# Patient Record
Sex: Male | Born: 1960 | Hispanic: Yes | Marital: Married | Smoking: Never smoker
Health system: Southern US, Community
[De-identification: ages and names within clinical notes are randomized; demographics above are authoritative.]

## PROBLEM LIST (undated history)

## (undated) DIAGNOSIS — I10 Essential (primary) hypertension: Secondary | ICD-10-CM

---

## 2014-07-10 ENCOUNTER — Encounter (HOSPITAL_BASED_OUTPATIENT_CLINIC_OR_DEPARTMENT_OTHER): Payer: Self-pay | Admitting: Emergency Medicine

## 2014-07-10 DIAGNOSIS — Z79899 Other long term (current) drug therapy: Secondary | ICD-10-CM | POA: Diagnosis not present

## 2014-07-10 DIAGNOSIS — I10 Essential (primary) hypertension: Secondary | ICD-10-CM | POA: Insufficient documentation

## 2014-07-10 DIAGNOSIS — R05 Cough: Secondary | ICD-10-CM | POA: Diagnosis not present

## 2014-07-10 NOTE — ED Notes (Signed)
Pt and spouse report unrelenting cough has been tx by PMD with tamaflu and tussin cough med w/o relief cough continues

## 2014-07-11 ENCOUNTER — Emergency Department (HOSPITAL_BASED_OUTPATIENT_CLINIC_OR_DEPARTMENT_OTHER)
Admission: EM | Admit: 2014-07-11 | Discharge: 2014-07-11 | Disposition: A | Discharge status: Home / Self Care | Payer: 59 | Attending: Emergency Medicine | Admitting: Emergency Medicine

## 2014-07-11 ENCOUNTER — Emergency Department (HOSPITAL_BASED_OUTPATIENT_CLINIC_OR_DEPARTMENT_OTHER): Payer: 59

## 2014-07-11 DIAGNOSIS — R05 Cough: Secondary | ICD-10-CM

## 2014-07-11 DIAGNOSIS — R059 Cough, unspecified: Secondary | ICD-10-CM

## 2014-07-11 HISTORY — DX: Essential (primary) hypertension: I10

## 2014-07-11 MED ORDER — BENZONATATE 100 MG PO CAPS: 100.0000 mg | ORAL_CAPSULE | Freq: Three times a day (TID) | ORAL | Status: DC | PRN

## 2014-07-11 MED ORDER — BENZONATATE 100 MG PO CAPS
100.0000 mg | ORAL_CAPSULE | Freq: Once | ORAL | Status: AC
Start: 2014-07-11 — End: 2014-07-11
  Administered 2014-07-11: 100 mg via ORAL
  Filled 2014-07-11: qty 1

## 2014-07-11 NOTE — ED Notes (Signed)
[facility] Pt seen by ED[facility] prior to RN assessment, see MD notes, pending orders. Xray here for pt.

## 2014-07-11 NOTE — ED Provider Notes (Addendum)
CSN: [id number]     Arrival date & time 07/10/14  2206 History  This chart was scribed for Shon Baton, MD by Modena Jansky, ED Scribe. This patient was seen in room MH01/MH01 and the patient's care was started at 12:18 AM.   Chief Complaint  Patient presents with  . Cough   The history is provided by the patient. No language interpreter was used.   HPI Comments: Glenn Hooper is a 54 y.o. male who presents to the Emergency Department complaining of an intermittent moderate cough that started about 11 days ago. He reports that he has been having a persistent, nonproductive cough for the past 11 days. He reports that he recently saw a provider and given medication with temporary relief. He states that he has also been taking robitussin with minimal relief. He reports having a sore throat also. He denies any chest pain or abdominal pain.   Past Medical History  Diagnosis Date  . Hypertension    History reviewed. No pertinent past surgical history. History reviewed. No pertinent family history. History  Substance Use Topics  . Smoking status: Never Smoker   . Smokeless tobacco: Never Used  . Alcohol Use: No    Review of Systems  Constitutional: Negative.  Negative for fever.  Respiratory: Positive for cough. Negative for chest tightness and shortness of breath.   Cardiovascular: Negative.  Negative for chest pain.  Gastrointestinal: Negative.  Negative for abdominal pain.  Genitourinary: Negative.   Neurological: Negative for headaches.  All other systems reviewed and are negative.   Allergies  Review of patient's allergies indicates no known allergies.  Home Medications   Prior to Admission medications   Medication Sig Start Date End Date Taking? Authorizing Provider  benzonatate (TESSALON) 100 MG capsule Take 1 capsule (100 mg total) by mouth 3 (three) times daily as needed for cough. 07/11/14   Shon Baton, MD  lisinopril (PRINIVIL,ZESTRIL) 10 MG tablet Take 15 mg by  mouth daily.   Yes Historical Provider, MD   BP 134/65 mmHg  Pulse 67  Temp(Src) 97.9 F (36.6 C) (Oral)  Resp 20  Ht  (1.702 m)  Wt 197 lb (89.359 kg)  BMI 30.85 kg/m2  SpO2 96% Physical Exam  Constitutional: He is oriented to person, place, and time. He appears well-developed and well-nourished.  HENT:  Head: Normocephalic and atraumatic.  Cardiovascular: Normal rate, regular rhythm and normal heart sounds.   No murmur heard. Pulmonary/Chest: Effort normal and breath sounds normal. No respiratory distress. He has no wheezes.  Abdominal: Soft. There is no tenderness.  Musculoskeletal: He exhibits no edema.  Neurological: He is alert and oriented to person, place, and time.  Skin: Skin is warm and dry.  Psychiatric: He has a normal mood and affect.  Nursing note and vitals reviewed.   ED Course  Procedures (including critical care time) DIAGNOSTIC STUDIES: Oxygen Saturation is 96% on RA, normal by my interpretation.    COORDINATION OF CARE: 12:22 AM- Pt advised of plan for treatment which includes radiology and pt agrees.  Labs Review Labs Reviewed - No data to display  Imaging Review Dg Chest 2 View  07/11/2014   CLINICAL DATA:  Cough for 11 days.  EXAM: CHEST  2 VIEW  COMPARISON:  None.  FINDINGS: There is a shallow inspiration with associated crowding of the basilar markings. There may be mild linear atelectatic changes. There is no confluent airspace consolidation. There are no effusions. Hilar, mediastinal and cardiac contours are  unremarkable. Pulmonary vasculature is normal.  IMPRESSION: Shallow inspiration.  Mild linear basilar opacities.   Electronically Signed   By: Ellery Plunkaniel R Mitchell M.D.   On: 07/11/2014 00:50     EKG Interpretation None      MDM   Final diagnoses:  Cough     Ptient presents with persistent cough after being diagnosed with the flu several weeks ago. On exam. No evidence of respiratory distress. No wheezing. Chest x-ray without  evidence of pneumonia. Patient given Jerilynn Somessalon Perles. Discuss with patient and his wife that cough can persist following an upper respiratory illness like flu for several weeks. They should continue supportive care. Will discharge with Tessalon Perles. At this time do not feel that an inhaler would benefit the patient as he is a nonsmoker and has no wheezing on exam.  Of note, patient is on an ACE inhibitor. He has been on a simple inhibitor for some time. This can also cause cough but given recent URI symptoms suspect persistent cough from viral illness rather than ACE inhibitor induced cough.  After history, exam, and medical workup I feel the patient has been appropriately medically screened and is safe for discharge home. Pertinent diagnoses were discussed with the patient. Patient was given return precautions.  I personally performed the services described in this documentation, which was scribed in my presence. The recorded information has been reviewed and is accurate.     Shon Batonourtney F Leanny Moeckel, MD 07/11/14 [id number]  Shon Batonourtney F Ranjit Ashurst, MD 07/11/14 [phone]

## 2014-07-11 NOTE — ED Notes (Addendum)
Ambulatory to xray, steady gait, alert, NAD, calm, interactive, no dyspnea.

## 2014-07-11 NOTE — ED Notes (Addendum)
Alert, NAD, calm, interactive, (denies: congestion, fever, nvd, dizziness, sob, sore throat or pain), [facility] States, "cough is keeping me from sleeping". "robitussin not helping".

## 2014-07-11 NOTE — Discharge Instructions (Signed)
Cough, Adult   A cough is a reflex. It helps you clear your throat and airways. A cough can help heal your body. A cough can last 2 or 3 weeks (acute) or may last more than 8 weeks (chronic). Some common causes of a cough can include an infection, allergy, or a cold.  HOME CARE  · Only take medicine as told by your doctor.  · If given, take your medicines (antibiotics) as told. Finish them even if you start to feel better.  · Use a cold steam vaporizer or humidifier in your home. This can help loosen thick spit (secretions).  · Sleep so you are almost sitting up (semi-upright). Use pillows to do this. This helps reduce coughing.  · Rest as needed.  · Stop smoking if you smoke.  GET HELP RIGHT AWAY IF:  · You have yellowish-white fluid (pus) in your thick spit.  · Your cough gets worse.  · Your medicine does not reduce coughing, and you are losing sleep.  · You cough up blood.  · You have trouble breathing.  · Your pain gets worse and medicine does not help.  · You have a fever.  MAKE SURE YOU:   · Understand these instructions.  · Will watch your condition.  · Will get help right away if you are not doing well or get worse.  Document Released: 12/07/2010 Document Revised: 08/10/2013 Document Reviewed: 12/07/2010  ExitCare® Patient Information ©2015 [employer]. This information is not intended to replace advice given to you by your health care provider. Make sure you discuss any questions you have with your health care provider.

## 2015-02-13 ENCOUNTER — Encounter (HOSPITAL_BASED_OUTPATIENT_CLINIC_OR_DEPARTMENT_OTHER): Payer: Self-pay | Admitting: Emergency Medicine

## 2015-02-13 ENCOUNTER — Emergency Department (HOSPITAL_BASED_OUTPATIENT_CLINIC_OR_DEPARTMENT_OTHER)
Admission: EM | Admit: 2015-02-13 | Discharge: 2015-02-13 | Disposition: A | Discharge status: Home / Self Care | Payer: 59 | Attending: Emergency Medicine | Admitting: Emergency Medicine

## 2015-02-13 ENCOUNTER — Emergency Department (HOSPITAL_BASED_OUTPATIENT_CLINIC_OR_DEPARTMENT_OTHER): Payer: 59

## 2015-02-13 DIAGNOSIS — Z79899 Other long term (current) drug therapy: Secondary | ICD-10-CM | POA: Insufficient documentation

## 2015-02-13 DIAGNOSIS — I10 Essential (primary) hypertension: Secondary | ICD-10-CM | POA: Diagnosis not present

## 2015-02-13 DIAGNOSIS — R51 Headache: Secondary | ICD-10-CM

## 2015-02-13 DIAGNOSIS — R519 Headache, unspecified: Secondary | ICD-10-CM

## 2015-02-13 DIAGNOSIS — R0789 Other chest pain: Secondary | ICD-10-CM | POA: Insufficient documentation

## 2015-02-13 LAB — CBC WITH DIFFERENTIAL/PLATELET
BASOS PCT: 0 %
Basophils Absolute: 0 10*3/uL (ref 0.0–0.1)
EOS ABS: 0.1 10*3/uL (ref 0.0–0.7)
Eosinophils Relative: 2 %
HCT: 46.5 % (ref 39.0–52.0)
HEMOGLOBIN: 16 g/dL (ref 13.0–17.0)
Lymphocytes Relative: 27 %
Lymphs Abs: 2.1 10*3/uL (ref 0.7–4.0)
MCH: 30.4 pg (ref 26.0–34.0)
MCHC: 34.4 g/dL (ref 30.0–36.0)
MCV: 88.2 fL (ref 78.0–100.0)
Monocytes Absolute: 0.5 10*3/uL (ref 0.1–1.0)
Monocytes Relative: 6 %
NEUTROS PCT: 65 %
Neutro Abs: 4.9 10*3/uL (ref 1.7–7.7)
Platelets: 205 10*3/uL (ref 150–400)
RBC: 5.27 MIL/uL (ref 4.22–5.81)
RDW: 12.6 % (ref 11.5–15.5)
WBC: 7.6 10*3/uL (ref 4.0–10.5)

## 2015-02-13 LAB — TROPONIN I: Troponin I: <0.03 ng/mL (ref <0.031)

## 2015-02-13 LAB — URINALYSIS, ROUTINE W REFLEX MICROSCOPIC
BILIRUBIN URINE: NEGATIVE
GLUCOSE, UA: NEGATIVE mg/dL
HGB URINE DIPSTICK: NEGATIVE
KETONES UR: NEGATIVE mg/dL
Leukocytes, UA: NEGATIVE
Nitrite: NEGATIVE
PROTEIN: NEGATIVE mg/dL
Specific Gravity, Urine: 1.014 (ref 1.005–1.030)
UROBILINOGEN UA: 0.2 mg/dL (ref 0.0–1.0)
pH: 7 (ref 5.0–8.0)

## 2015-02-13 LAB — BASIC METABOLIC PANEL
Anion gap: 5 (ref 5–15)
BUN: 16 mg/dL (ref 6–20)
CO2: 29 mmol/L (ref 22–32)
Calcium: 9.7 mg/dL (ref 8.9–10.3)
Chloride: 106 mmol/L (ref 101–111)
Creatinine, Ser: 1.19 mg/dL (ref 0.61–1.24)
GFR calc Af Amer: >60 mL/min (ref >60)
GLUCOSE: 106 mg/dL — AB (ref 65–99)
Potassium: 4.6 mmol/L (ref 3.5–5.1)
SODIUM: 140 mmol/L (ref 135–145)

## 2015-02-13 MED ORDER — ACETAMINOPHEN 500 MG PO TABS
1000.0000 mg | ORAL_TABLET | Freq: Once | ORAL | Status: AC
  Administered 2015-02-13: 1000 mg via ORAL
  Filled 2015-02-13: qty 2

## 2015-02-13 MED ORDER — CLONIDINE HCL 0.1 MG PO TABS
0.1000 mg | ORAL_TABLET | Freq: Once | ORAL | Status: AC
  Administered 2015-02-13: 0.1 mg via ORAL
  Filled 2015-02-13: qty 1

## 2015-02-13 MED ORDER — CLONIDINE HCL 0.1 MG PO TABS: 0.1000 mg | ORAL_TABLET | Freq: Two times a day (BID) | ORAL | Status: AC

## 2015-02-13 MED ORDER — LISINOPRIL 10 MG PO TABS: 20.0000 mg | ORAL_TABLET | Freq: Every day | ORAL | Status: AC

## 2015-02-13 MED ORDER — LISINOPRIL 10 MG PO TABS
20.0000 mg | ORAL_TABLET | Freq: Once | ORAL | Status: AC
  Administered 2015-02-13: 20 mg via ORAL
  Filled 2015-02-13: qty 2

## 2015-02-13 NOTE — ED Provider Notes (Addendum)
CSN: [id number]     Arrival date & time 02/13/15  1734 History  By signing my name below, I, Terrance Branch, attest that this documentation has been prepared under the direction and in the presence of Arby BarretteMarcy Wren Gallaga, MD. Electronically Signed: Evon Slackerrance Branch, ED Scribe. 02/13/2015. 10:53 PM.    Chief Complaint  Patient presents with  . Headache    Patient is a 54 y.o. male presenting with headaches. The history is provided by the patient. No language interpreter was used.  Headache  HPI Comments: Billye Loreta Avecosta is a 54 y.o. male who presents to the Emergency Department complaining of HA onset 3 days prior. Pt st he thinks his HA's are related to his blood pressure. He states that his blood pressure has been elevated for the past 3 days in the 158/99 range. Pt states that he has tried increasing his blood pressure medication with no relief. Pt reports chest pressure and SOB that lasted for about 1 minute last night that resolved on its own. Denies leg swelling, vision changes, weakness, numbness, nausea, vomiting, fever chills, difficulty urinating.     Past Medical History  Diagnosis Date  . Hypertension    History reviewed. No pertinent past surgical history. History reviewed. No pertinent family history. Social History  Substance Use Topics  . Smoking status: Never Smoker   . Smokeless tobacco: Never Used  . Alcohol Use: No    Review of Systems  Neurological: Positive for headaches.   10 Systems reviewed and all are negative for acute change except as noted in the HPI.     Allergies  Review of patient's allergies indicates no known allergies.  Home Medications   Prior to Admission medications   Medication Sig Start Date End Date Taking? Authorizing Provider  cloNIDine (CATAPRES) 0.1 MG tablet Take 1 tablet (0.1 mg total) by mouth 2 (two) times daily. Take 1 tablet up to 3 times daily for blood pressure that remains greater than 140/90 after taking your prescribed  lisinopril. 02/13/15   Arby BarretteMarcy Carless Slatten, MD  lisinopril (PRINIVIL,ZESTRIL) 10 MG tablet Take 15 mg by mouth daily.    Historical Provider, MD  lisinopril (PRINIVIL,ZESTRIL) 10 MG tablet Take 2 tablets (20 mg total) by mouth daily. Take 20 mg of lisinopril in the morning and 10 mg lisinopril in the evening. 02/13/15   Arby BarretteMarcy Pier Bosher, MD   BP 114/76 mmHg  Pulse 57  Temp(Src) 97.9 F (36.6 C) (Oral)  Resp 20  Ht 5\' 8"  (1.727 m)  Wt 185 lb (83.915 kg)  BMI 28.14 kg/m2  SpO2 98%   Physical Exam  Constitutional: He is oriented to person, place, and time. He appears well-developed and well-nourished.  HENT:  Head: Normocephalic and atraumatic.  Eyes: EOM are normal. Pupils are equal, round, and reactive to light.  Neck: Neck supple.  Cardiovascular: Normal rate, regular rhythm, normal heart sounds and intact distal pulses.   Pulmonary/Chest: Effort normal and breath sounds normal.  Abdominal: Soft. Bowel sounds are normal. He exhibits no distension. There is no tenderness.  Musculoskeletal: Normal range of motion. He exhibits no edema.  Neurological: He is alert and oriented to person, place, and time. He has normal strength. Coordination normal. GCS eye subscore is 4. GCS verbal subscore is 5. GCS motor subscore is 6.  Skin: Skin is warm, dry and intact.  Psychiatric: He has a normal mood and affect.    ED Course  Procedures (including critical care time) DIAGNOSTIC STUDIES: Oxygen Saturation is 97% on RA, normal  by my interpretation.    COORDINATION OF CARE: 8:07 PM-Discussed treatment plan with pt at bedside and pt agreed to plan.     Labs Review Labs Reviewed  BASIC METABOLIC PANEL - Abnormal; Notable for the following:    Glucose, Bld 106 (*)    All other components within normal limits  URINALYSIS, ROUTINE W REFLEX MICROSCOPIC (NOT AT [facility]) - Abnormal; Notable for the following:    APPearance CLOUDY (*)    All other components within normal limits  TROPONIN I  CBC WITH  DIFFERENTIAL/PLATELET    Imaging Review Dg Chest 2 View  02/13/2015  CLINICAL DATA:  Chest pain and shortness of breath. Uncontrolled hypertension. EXAM: CHEST  2 VIEW COMPARISON:  07/11/2014 FINDINGS: Heart size and pulmonary vascularity are normal. Slight scarring at the left base laterally. The lungs are otherwise clear. No effusions. No osseous abnormality. IMPRESSION: No acute abnormality.  Slight scarring at the left lung base. Electronically Signed   By: Francene Boyers M.D.   On: 02/13/2015 20:35      EKG Interpretation   Date/Time:  Sunday February 13 2015 21:30:31 EST Ventricular Rate:  65 PR Interval:  150 QRS Duration: 108 QT Interval:  408 QTC Calculation: 424 R Axis:   -26 Text Interpretation:  Normal sinus rhythm Incomplete right bundle branch  block Borderline ECG Confirmed by Donnald Garre, MD, Lebron Conners [phone]) on 02/13/2015  10:42:12 PM     Recheck 22:45, patient reports he feels much better. He is has no symptoms at this time. MDM   Final diagnoses:  Essential hypertension  Acute nonintractable headache, unspecified headache type    Patient presents with elevated blood pressure over the past several days of 150s over 100s. He reports generalized headache of gradual onset in association with this. He also identifies some chest pressure yesterday evening. Patient was given an additional dose of his lisinopril and a 0.1 mg dose of clonidine. His blood pressure normalized with this treatment and he reported himself asymptomatic. At this time his diagnostic workup is negative. The patient be discharged with a lisinopril dose increased to 20 mg in the morning and 10 mg in the evening with clonidine to take on an as needed basis. He is advised that he needs to follow-up with his family physician this week for monitoring of blood pressures and management of his medications.     Arby Barrette, MD 02/13/15 2255  Arby Barrette, MD 02/13/15 2256

## 2015-02-13 NOTE — ED Notes (Signed)
States, "feel better", admits to some lingering mild dizziness, (denies: pain, HA, sob, nausea or other sx), VSS[facility]

## 2015-02-13 NOTE — Discharge Instructions (Signed)
Hypertension Hypertension, commonly called high blood pressure, is when the force of blood pumping through your arteries is too strong. Your arteries are the blood vessels that carry blood from your heart throughout your body. A blood pressure reading consists of a higher number over a lower number, such as 110/72. The higher number (systolic) is the pressure inside your arteries when your heart pumps. The lower number (diastolic) is the pressure inside your arteries when your heart relaxes. Ideally you want your blood pressure below 120/80. Hypertension forces your heart to work harder to pump blood. Your arteries may become narrow or stiff. Having untreated or uncontrolled hypertension can cause heart attack, stroke, kidney disease, and other problems. RISK FACTORS Some risk factors for high blood pressure are controllable. Others are not.  Risk factors you cannot control include:   Race. You may be at higher risk if you are African American.  Age. Risk increases with age.  Gender. Men are at higher risk than women before age 45 years. After age 65, women are at higher risk than men. Risk factors you can control include:  Not getting enough exercise or physical activity.  Being overweight.  Getting too much fat, sugar, calories, or salt in your diet.  Drinking too much alcohol. SIGNS AND SYMPTOMS Hypertension does not usually cause signs or symptoms. Extremely high blood pressure (hypertensive crisis) may cause headache, anxiety, shortness of breath, and nosebleed. DIAGNOSIS To check if you have hypertension, your health care provider will measure your blood pressure while you are seated, with your arm held at the level of your heart. It should be measured at least twice using the same arm. Certain conditions can cause a difference in blood pressure between your right and left arms. A blood pressure reading that is higher than normal on one occasion does not mean that you need treatment. If  it is not clear whether you have high blood pressure, you may be asked to return on a different day to have your blood pressure checked again. Or, you may be asked to monitor your blood pressure at home for 1 or more weeks. TREATMENT Treating high blood pressure includes making lifestyle changes and possibly taking medicine. Living a healthy lifestyle can help lower high blood pressure. You may need to change some of your habits. Lifestyle changes may include:  Following the DASH diet. This diet is high in fruits, vegetables, and whole grains. It is low in salt, red meat, and added sugars.  Keep your sodium intake below 2,300 mg per day.  Getting at least 30-45 minutes of aerobic exercise at least 4 times per week.  Losing weight if necessary.  Not smoking.  Limiting alcoholic beverages.  Learning ways to reduce stress. Your health care provider may prescribe medicine if lifestyle changes are not enough to get your blood pressure under control, and if one of the following is true:  You are 18-59 years of age and your systolic blood pressure is above 140.  You are 60 years of age or older, and your systolic blood pressure is above 150.  Your diastolic blood pressure is above 90.  You have diabetes, and your systolic blood pressure is over 140 or your diastolic blood pressure is over 90.  You have kidney disease and your blood pressure is above 140/90.  You have heart disease and your blood pressure is above 140/90. Your personal target blood pressure may vary depending on your medical conditions, your age, and other factors. HOME CARE INSTRUCTIONS    Have your blood pressure rechecked as directed by your health care provider.   Take medicines only as directed by your health care provider. Follow the directions carefully. Blood pressure medicines must be taken as prescribed. The medicine does not work as well when you skip doses. Skipping doses also puts you at risk for  problems.  Do not smoke.   Monitor your blood pressure at home as directed by your health care provider. SEEK MEDICAL CARE IF:   You think you are having a reaction to medicines taken.  You have recurrent headaches or feel dizzy.  You have swelling in your ankles.  You have trouble with your vision. SEEK IMMEDIATE MEDICAL CARE IF:  You develop a severe headache or confusion.  You have unusual weakness, numbness, or feel faint.  You have severe chest or abdominal pain.  You vomit repeatedly.  You have trouble breathing. MAKE SURE YOU:   Understand these instructions.  Will watch your condition.  Will get help right away if you are not doing well or get worse.   This information is not intended to replace advice given to you by your health care provider. Make sure you discuss any questions you have with your health care provider.   Document Released: 03/26/2005 Document Revised: 08/10/2014 Document Reviewed: 01/16/2013 Elsevier Interactive Patient Education 2016 Elsevier Inc. DASH Eating Plan DASH stands for "Dietary Approaches to Stop Hypertension." The DASH eating plan is a healthy eating plan that has been shown to reduce high blood pressure (hypertension). Additional health benefits may include reducing the risk of type 2 diabetes mellitus, heart disease, and stroke. The DASH eating plan may also help with weight loss. WHAT DO I NEED TO KNOW ABOUT THE DASH EATING PLAN? For the DASH eating plan, you will follow these general guidelines:  Choose foods with a percent daily value for sodium of less than 5% (as listed on the food label).  Use salt-free seasonings or herbs instead of table salt or sea salt.  Check with your health care provider or pharmacist before using salt substitutes.  Eat lower-sodium products, often labeled as "lower sodium" or "no salt added."  Eat fresh foods.  Eat more vegetables, fruits, and low-fat dairy products.  Choose whole grains.  Look for the word "whole" as the first word in the ingredient list.  Choose fish and skinless chicken or turkey more often than red meat. Limit fish, poultry, and meat to 6 oz (170 g) each day.  Limit sweets, desserts, sugars, and sugary drinks.  Choose heart-healthy fats.  Limit cheese to 1 oz (28 g) per day.  Eat more home-cooked food and less restaurant, buffet, and fast food.  Limit fried foods.  Cook foods using methods other than frying.  Limit canned vegetables. If you do use them, rinse them well to decrease the sodium.  When eating at a restaurant, ask that your food be prepared with less salt, or no salt if possible. WHAT FOODS CAN I EAT? Seek help from a dietitian for individual calorie needs. Grains Whole grain or whole wheat bread. Brown rice. Whole grain or whole wheat pasta. Quinoa, bulgur, and whole grain cereals. Low-sodium cereals. Corn or whole wheat flour tortillas. Whole grain cornbread. Whole grain crackers. Low-sodium crackers. Vegetables Fresh or frozen vegetables (raw, steamed, roasted, or grilled). Low-sodium or reduced-sodium tomato and vegetable juices. Low-sodium or reduced-sodium tomato sauce and paste. Low-sodium or reduced-sodium canned vegetables.  Fruits All fresh, canned (in natural juice), or frozen fruits. Meat and Other   Protein Products Ground beef (85% or leaner), grass-fed beef, or beef trimmed of fat. Skinless chicken or turkey. Ground chicken or turkey. Pork trimmed of fat. All fish and seafood. Eggs. Dried beans, peas, or lentils. Unsalted nuts and seeds. Unsalted canned beans. Dairy Low-fat dairy products, such as skim or 1% milk, 2% or reduced-fat cheeses, low-fat ricotta or cottage cheese, or plain low-fat yogurt. Low-sodium or reduced-sodium cheeses. Fats and Oils Tub margarines without trans fats. Light or reduced-fat mayonnaise and salad dressings (reduced sodium). Avocado. Safflower, olive, or canola oils. Natural peanut or almond  butter. Other Unsalted popcorn and pretzels. The items listed above may not be a complete list of recommended foods or beverages. Contact your dietitian for more options. WHAT FOODS ARE NOT RECOMMENDED? Grains White bread. White pasta. White rice. Refined cornbread. Bagels and croissants. Crackers that contain trans fat. Vegetables Creamed or fried vegetables. Vegetables in a cheese sauce. Regular canned vegetables. Regular canned tomato sauce and paste. Regular tomato and vegetable juices. Fruits Dried fruits. Canned fruit in light or heavy syrup. Fruit juice. Meat and Other Protein Products Fatty cuts of meat. Ribs, chicken wings, bacon, sausage, bologna, salami, chitterlings, fatback, hot dogs, bratwurst, and packaged luncheon meats. Salted nuts and seeds. Canned beans with salt. Dairy Whole or 2% milk, cream, half-and-half, and cream cheese. Whole-fat or sweetened yogurt. Full-fat cheeses or blue cheese. Nondairy creamers and whipped toppings. Processed cheese, cheese spreads, or cheese curds. Condiments Onion and garlic salt, seasoned salt, table salt, and sea salt. Canned and packaged gravies. Worcestershire sauce. Tartar sauce. Barbecue sauce. Teriyaki sauce. Soy sauce, including reduced sodium. Steak sauce. Fish sauce. Oyster sauce. Cocktail sauce. Horseradish. Ketchup and mustard. Meat flavorings and tenderizers. Bouillon cubes. Hot sauce. Tabasco sauce. Marinades. Taco seasonings. Relishes. Fats and Oils Butter, stick margarine, lard, shortening, ghee, and bacon fat. Coconut, palm kernel, or palm oils. Regular salad dressings. Other Pickles and olives. Salted popcorn and pretzels. The items listed above may not be a complete list of foods and beverages to avoid. Contact your dietitian for more information. WHERE CAN I FIND MORE INFORMATION? National Heart, Lung, and Blood Institute: www.nhlbi.nih.gov/health/health-topics/topics/dash/   This information is not intended to replace  advice given to you by your health care provider. Make sure you discuss any questions you have with your health care provider.   Document Released: 03/15/2011 Document Revised: 04/16/2014 Document Reviewed: 01/28/2013 Elsevier Interactive Patient Education 2016 Elsevier Inc.  

## 2015-02-13 NOTE — ED Notes (Signed)
Pt reports headache from high blood pressure x 3 days

## 2017-02-02 ENCOUNTER — Emergency Department (HOSPITAL_BASED_OUTPATIENT_CLINIC_OR_DEPARTMENT_OTHER): Payer: BLUE CROSS/BLUE SHIELD

## 2017-02-02 ENCOUNTER — Emergency Department (HOSPITAL_BASED_OUTPATIENT_CLINIC_OR_DEPARTMENT_OTHER)
Admission: EM | Admit: 2017-02-02 | Discharge: 2017-02-03 | Disposition: A | Discharge status: Home / Self Care | Payer: BLUE CROSS/BLUE SHIELD | Attending: Physician Assistant | Admitting: Physician Assistant

## 2017-02-02 ENCOUNTER — Encounter (HOSPITAL_BASED_OUTPATIENT_CLINIC_OR_DEPARTMENT_OTHER): Payer: Self-pay | Admitting: Emergency Medicine

## 2017-02-02 DIAGNOSIS — I10 Essential (primary) hypertension: Secondary | ICD-10-CM | POA: Diagnosis present

## 2017-02-02 DIAGNOSIS — Z79899 Other long term (current) drug therapy: Secondary | ICD-10-CM | POA: Insufficient documentation

## 2017-02-02 DIAGNOSIS — G43009 Migraine without aura, not intractable, without status migrainosus: Secondary | ICD-10-CM | POA: Insufficient documentation

## 2017-02-02 NOTE — ED Triage Notes (Signed)
PT presents with c/o htn today 155/97 at home and took 25mg  of lisinopril. Pt reports headache 8/10 and dizziness.

## 2017-02-02 NOTE — ED Notes (Signed)
Patient stated that he has a headache due to BP of 143/88.  He claimed that this is high for him.

## 2017-02-03 MED ORDER — DIPHENHYDRAMINE HCL 25 MG PO CAPS
25.0000 mg | ORAL_CAPSULE | Freq: Once | ORAL | Status: AC
  Administered 2017-02-03: 25 mg via ORAL
  Filled 2017-02-03: qty 1

## 2017-02-03 MED ORDER — KETOROLAC TROMETHAMINE 30 MG/ML IJ SOLN
30.0000 mg | Freq: Once | INTRAMUSCULAR | Status: AC
  Administered 2017-02-03: 30 mg via INTRAVENOUS
  Filled 2017-02-03: qty 1

## 2017-02-03 MED ORDER — PROCHLORPERAZINE EDISYLATE 5 MG/ML IJ SOLN
5.0000 mg | Freq: Once | INTRAMUSCULAR | Status: AC
  Administered 2017-02-03: 5 mg via INTRAVENOUS
  Filled 2017-02-03: qty 2

## 2017-02-03 MED ORDER — SODIUM CHLORIDE 0.9 % IV BOLUS (SEPSIS)
1000.0000 mL | Freq: Once | INTRAVENOUS | Status: AC
  Administered 2017-02-03: 1000 mL via INTRAVENOUS

## 2017-02-03 NOTE — ED Notes (Signed)
ED Provider at bedside. 

## 2017-02-03 NOTE — ED Provider Notes (Signed)
[facility] EMERGENCY DEPARTMENT Provider Note   CSN: [id number] Arrival date & time: 02/02/17  2143     History   Chief Complaint Chief Complaint  Patient presents with  . Hypertension    HPI Glenn Hooper is a 56 y.o. male.  HPI   56 year old male presenting with hypertension and headache.  Patient's wife reports that he has been taking his blood pressure noted to be high as well as him having a headache.  Patient is on 5 lisinopril every other day.  She reports when he is on 5 every day his he gets too low for blood pressure.  Patient has a headache for the last day.  Patient's wife said he has a history of headaches.  Patient's had brain MRIs in the past done in the [place] that have been reassuring.  Past Medical History:  Diagnosis Date  . Hypertension     There are no active problems to display for this patient.   History reviewed. No pertinent surgical history.     Home Medications    Prior to Admission medications   Medication Sig Start Date End Date Taking? Authorizing Provider  cloNIDine (CATAPRES) 0.1 MG tablet Take 1 tablet (0.1 mg total) by mouth 2 (two) times daily. Take 1 tablet up to 3 times daily for blood pressure that remains greater than 140/90 after taking your prescribed lisinopril. 02/13/15   Arby BarrettePfeiffer, Marcy, MD  lisinopril (PRINIVIL,ZESTRIL) 10 MG tablet Take 15 mg by mouth daily.    [provider]  lisinopril (PRINIVIL,ZESTRIL) 10 MG tablet Take 2 tablets (20 mg total) by mouth daily. Take 20 mg of lisinopril in the morning and 10 mg lisinopril in the evening. 02/13/15   Arby BarrettePfeiffer, Marcy, MD    Family History No family history on file.  Social History Social History  Substance Use Topics  . Smoking status: Never Smoker  . Smokeless tobacco: Never Used  . Alcohol use No     Allergies   Patient has no known allergies.   Review of Systems Review of Systems  Constitutional: Negative for activity change.    Respiratory: Negative for shortness of breath.   Cardiovascular: Negative for chest pain.  Gastrointestinal: Negative for abdominal pain.  Neurological: Positive for headaches.     Physical Exam Updated Vital Signs BP (!) 144/95   Pulse 70   Temp 98.4 F (36.9 C) (Oral)   Resp 20   Ht 5\' 8"  (1.727 m)   Wt 83.9 kg (185 lb)   SpO2 98%   BMI 28.13 kg/m   Physical Exam  Constitutional: He is oriented to person, place, and time. He appears well-nourished.  HENT:  Head: Normocephalic.  Eyes: Conjunctivae are normal.  Cardiovascular: Normal rate and regular rhythm.   Pulmonary/Chest: Effort normal and breath sounds normal. No respiratory distress.  Abdominal: Soft. He exhibits no distension. There is no tenderness.  Neurological: He is oriented to person, place, and time.  Equal strength bilaterally upper and lower extremities negative pronator drift. Normal sensation bilaterally. Speech comprehensible, no slurring. Facial nerve tested and appears grossly normal. Alert and oriented 3.   Skin: Skin is warm and dry. He is not diaphoretic.  Psychiatric: He has a normal mood and affect. His behavior is normal.     ED Treatments / Results  Labs (all labs ordered are listed, but only abnormal results are displayed) Labs Reviewed - No data to display  EKG  EKG Interpretation  Date/Time:  Saturday February 02 2017  22:05:16 EDT Ventricular Rate:  64 PR Interval:  174 QRS Duration: 114 QT Interval:  416 QTC Calculation: 429 R Axis:   -17 Text Interpretation:  Normal sinus rhythm Incomplete right bundle branch block Borderline ECG No significant change since last tracing Confirmed by Bary Castilla ([id number]) on 02/02/2017 10:58:11 PM       Radiology Ct Head Wo Contrast  Result Date: 02/02/2017 CLINICAL DATA:  56 year old male with generalized headache and dizziness. EXAM: CT HEAD WITHOUT CONTRAST TECHNIQUE: Contiguous axial images were obtained from the base of the skull  through the vertex without intravenous contrast. COMPARISON:  None. FINDINGS: Brain: The ventricles and sulci appropriate size for patient's age. Age the gray-white matter discrimination is preserved. There is no acute intracranial hemorrhage. No mass effect or midline shift noted. No extra-axial fluid collections. Vascular: No hyperdense vessel or unexpected calcification. Skull: Normal. Negative for fracture or focal lesion. Sinuses/Orbits: Mild mucoperiosteal thickening of paranasal sinuses. No air-fluid levels. Mastoid air cells are clear. Other: None IMPRESSION: Normal unenhanced CT of the brain. Electronically Signed   By: Elgie Collard M.D.   On: 02/02/2017 23:28    Procedures Procedures (including critical care time)  Medications Ordered in ED Medications  prochlorperazine (COMPAZINE) injection 5 mg (not administered)  diphenhydrAMINE (BENADRYL) capsule 25 mg (not administered)  sodium chloride 0.9 % bolus 1,000 mL (not administered)  ketorolac (TORADOL) 30 MG/ML injection 30 mg (not administered)     Initial Impression / Assessment and Plan / ED Course  I have reviewed the triage vital signs and the nursing notes.  Pertinent labs & imaging results that were available during my care of the patient were reviewed by me and considered in my medical decision making (see chart for details).     56 year old male presenting with hypertension and headache.  Patient's wife reports that he has been taking his blood pressure noted to be high as well as him having a headache.  Patient is on 5 lisinopril every other day.  She reports when he is on 5 every day his he gets too low for blood pressure.  Patient has a headache for the last day.  Patient's wife said he has a history of headaches.  Patient's had brain MRIs in the past done in the [place] that have been reassuring. 12:48 AM Patient CT is normal.  Patient's blood pressure is reassuring.  We will give migraine treatment and  then outpatient follow-up with outpatient physicians.  Final Clinical Impressions(s) / ED Diagnoses   Final diagnoses:  None    New Prescriptions New Prescriptions   No medications on file     Abelino Derrick, MD 02/03/17 [phone]

## 2017-02-24 ENCOUNTER — Emergency Department (HOSPITAL_BASED_OUTPATIENT_CLINIC_OR_DEPARTMENT_OTHER): Payer: BLUE CROSS/BLUE SHIELD

## 2017-02-24 ENCOUNTER — Other Ambulatory Visit: Payer: Self-pay

## 2017-02-24 ENCOUNTER — Emergency Department (HOSPITAL_BASED_OUTPATIENT_CLINIC_OR_DEPARTMENT_OTHER)
Admission: EM | Admit: 2017-02-24 | Discharge: 2017-02-24 | Disposition: A | Discharge status: Home / Self Care | Payer: BLUE CROSS/BLUE SHIELD | Attending: Emergency Medicine | Admitting: Emergency Medicine

## 2017-02-24 ENCOUNTER — Encounter (HOSPITAL_BASED_OUTPATIENT_CLINIC_OR_DEPARTMENT_OTHER): Payer: Self-pay | Admitting: *Deleted

## 2017-02-24 DIAGNOSIS — I10 Essential (primary) hypertension: Secondary | ICD-10-CM | POA: Insufficient documentation

## 2017-02-24 DIAGNOSIS — R079 Chest pain, unspecified: Secondary | ICD-10-CM | POA: Diagnosis present

## 2017-02-24 DIAGNOSIS — Z79899 Other long term (current) drug therapy: Secondary | ICD-10-CM | POA: Insufficient documentation

## 2017-02-24 LAB — CBC WITH DIFFERENTIAL/PLATELET
Basophils Absolute: 0 10*3/uL (ref 0.0–0.1)
Basophils Relative: 0 %
Eosinophils Absolute: 0.1 10*3/uL (ref 0.0–0.7)
Eosinophils Relative: 1 %
HCT: 40.6 % (ref 39.0–52.0)
Hemoglobin: 13.7 g/dL (ref 13.0–17.0)
Lymphocytes Relative: 25 %
Lymphs Abs: 1.5 10*3/uL (ref 0.7–4.0)
MCH: 30 pg (ref 26.0–34.0)
MCHC: 33.7 g/dL (ref 30.0–36.0)
MCV: 89 fL (ref 78.0–100.0)
MONOS PCT: 7 %
Monocytes Absolute: 0.4 10*3/uL (ref 0.1–1.0)
NEUTROS ABS: 4 10*3/uL (ref 1.7–7.7)
Neutrophils Relative %: 67 %
PLATELETS: 177 10*3/uL (ref 150–400)
RBC: 4.56 MIL/uL (ref 4.22–5.81)
RDW: 12.9 % (ref 11.5–15.5)
WBC: 6.1 10*3/uL (ref 4.0–10.5)

## 2017-02-24 LAB — BASIC METABOLIC PANEL
Anion gap: 4 — ABNORMAL LOW (ref 5–15)
BUN: 17 mg/dL (ref 6–20)
CO2: 27 mmol/L (ref 22–32)
Calcium: 9 mg/dL (ref 8.9–10.3)
Chloride: 105 mmol/L (ref 101–111)
Creatinine, Ser: 1.01 mg/dL (ref 0.61–1.24)
GFR calc Af Amer: >60 mL/min (ref >60)
GFR calc non Af Amer: >60 mL/min (ref >60)
GLUCOSE: 106 mg/dL — AB (ref 65–99)
Potassium: 3.4 mmol/L — ABNORMAL LOW (ref 3.5–5.1)
Sodium: 136 mmol/L (ref 135–145)

## 2017-02-24 LAB — TROPONIN I
Troponin I: <0.03 ng/mL (ref <0.03)
Troponin I: <0.03 ng/mL (ref <0.03)

## 2017-02-24 LAB — D-DIMER, QUANTITATIVE: D-Dimer, Quant: 0.41 ug/mL-FEU (ref 0.00–0.50)

## 2017-02-24 NOTE — ED Provider Notes (Signed)
[facility] EMERGENCY DEPARTMENT Provider Note   CSN: [id number] Arrival date & time: 02/24/17  0054     History   Chief Complaint Chief Complaint  Patient presents with  . Chest Pain    HPI Glenn Hooper is a 56 y.o. male.  Patient is a 56 year old male with a history of hypertension who presents with chest pain.  He states around 1130 this evening he had been sleeping but woke up and started having some pain to the center of his chest, it radiated from side to side and he had some tingling in his left hand.  He describes the pain as a pressure feeling.  There is no associated back pain.  He walked to the bathroom and got a little lightheaded.  The symptoms lasted about an hour and he has had no recurrence of the symptoms.  He denies any associated shortness of breath.  No nausea or vomiting.  No diaphoresis.  No prior history of heart problems.  No leg pain or swelling other than that he recently traveled to [place] and reports that he had some intermittent leg swelling which he attributes to the heat there.  He denies any calf tenderness.  No pleuritic type chest pain.  No cough or congestion.  No family history of heart problems.  He has a history of hypertension but no history of hyperlipidemia or diabetes.  He is a non-smoker.      Past Medical History:  Diagnosis Date  . Hypertension     There are no active problems to display for this patient.   History reviewed. No pertinent surgical history.     Home Medications    Prior to Admission medications   Medication Sig Start Date End Date Taking? Authorizing Provider  lisinopril (PRINIVIL,ZESTRIL) 10 MG tablet Take 2 tablets (20 mg total) by mouth daily. Take 20 mg of lisinopril in the morning and 10 mg lisinopril in the evening. 02/13/15  Yes Arby BarrettePfeiffer, Marcy, MD  cloNIDine (CATAPRES) 0.1 MG tablet Take 1 tablet (0.1 mg total) by mouth 2 (two) times daily. Take 1 tablet up to 3 times daily for blood  pressure that remains greater than 140/90 after taking your prescribed lisinopril. 02/13/15   Arby BarrettePfeiffer, Marcy, MD  lisinopril (PRINIVIL,ZESTRIL) 10 MG tablet Take 15 mg by mouth daily.    [provider]    Family History No family history on file.  Social History Social History   Tobacco Use  . Smoking status: Never Smoker  . Smokeless tobacco: Never Used  Substance Use Topics  . Alcohol use: No  . Drug use: No     Allergies   Patient has no known allergies.   Review of Systems Review of Systems  Constitutional: Negative for chills, diaphoresis, fatigue and fever.  HENT: Negative for congestion, rhinorrhea and sneezing.   Eyes: Negative.   Respiratory: Negative for cough, chest tightness and shortness of breath.   Cardiovascular: Positive for chest pain. Negative for leg swelling.  Gastrointestinal: Negative for abdominal pain, blood in stool, diarrhea, nausea and vomiting.  Genitourinary: Negative for difficulty urinating, flank pain, frequency and hematuria.  Musculoskeletal: Negative for arthralgias and back pain.  Skin: Negative for rash.  Neurological: Negative for dizziness, speech difficulty, weakness, numbness and headaches.     Physical Exam Updated Vital Signs BP 124/84   Pulse 62   Temp 98 F (36.7 C) (Oral)   Resp 15   SpO2 97%   Physical Exam  Constitutional: He is  oriented to person, place, and time. He appears well-developed and well-nourished.  HENT:  Head: Normocephalic and atraumatic.  Eyes: Pupils are equal, round, and reactive to light.  Neck: Normal range of motion. Neck supple.  Cardiovascular: Normal rate, regular rhythm and normal heart sounds.  Pulmonary/Chest: Effort normal and breath sounds normal. No respiratory distress. He has no wheezes. He has no rales. He exhibits no tenderness.  Abdominal: Soft. Bowel sounds are normal. There is no tenderness. There is no rebound and no guarding.  Musculoskeletal: Normal range of  motion. He exhibits no edema.  No edema or calf tenderness  Lymphadenopathy:    He has no cervical adenopathy.  Neurological: He is alert and oriented to person, place, and time.  Skin: Skin is warm and dry. No rash noted.  Psychiatric: He has a normal mood and affect.     ED Treatments / Results  Labs (all labs ordered are listed, but only abnormal results are displayed) Labs Reviewed  BASIC METABOLIC PANEL - Abnormal; Notable for the following components:      Result Value   Potassium 3.4 (*)    Glucose, Bld 106 (*)    Anion gap 4 (*)    All other components within normal limits  CBC WITH DIFFERENTIAL/PLATELET  TROPONIN I  D-DIMER, QUANTITATIVE (NOT AT [facility])  TROPONIN I    EKG  EKG Interpretation  Date/Time:  Sunday February 24 2017 00:59:57 EST Ventricular Rate:  71 PR Interval:  158 QRS Duration: 106 QT Interval:  416 QTC Calculation: 452 R Axis:   25 Text Interpretation:  Normal sinus rhythm Incomplete right bundle branch block Nonspecific ST abnormality Abnormal ECG since last tracing no significant change Confirmed by Rolan Bucco [phone]) on 02/24/2017 1:34:30 AM       Radiology Dg Chest 2 View  Result Date: 02/24/2017 CLINICAL DATA:  Subacute onset of generalized chest pain. Hands trembling. EXAM: CHEST  2 VIEW COMPARISON:  Chest radiograph performed 02/13/2015 FINDINGS: Mild left basilar atelectasis or scarring is noted. No pleural effusion or pneumothorax is seen. The heart is normal in size. No acute osseous abnormalities are identified. IMPRESSION: Mild left basilar atelectasis or scarring noted. Lungs otherwise clear. Electronically Signed   By: Roanna Raider M.D.   On: 02/24/2017 01:46    Procedures Procedures (including critical care time)  Medications Ordered in ED Medications - No data to display   Initial Impression / Assessment and Plan / ED Course  I have reviewed the triage vital signs and the nursing notes.  Pertinent labs & imaging  results that were available during my care of the patient were reviewed by me and considered in my medical decision making (see chart for details).    Patient is a 56 year old male with a history of hypertension who presents with chest pain.  His EKG does not show any ischemic changes.  He has had 2- troponins.  His d-dimer is negative and he has no other symptoms that would be more suggestive of pulmonary embolus.  He has a heart score of 3.  Given this, in discussion with the patient and his wife, we have decided that he will be discharged home and will have close follow-up with his PCP.  He has had no chest pain since arrival to the ED.  He has had no exertional symptoms.  He was discharged home in good condition.  He was advised to follow-up with his primary care physician within the next 2-3 days.  Return precautions were given.  Final Clinical Impressions(s) / ED Diagnoses   Final diagnoses:  Nonspecific chest pain    ED Discharge Orders    None       Rolan BuccoBelfi, Katalin Colledge, MD 02/24/17 0600

## 2017-02-24 NOTE — ED Triage Notes (Signed)
Pt woke with chest pain 30 mins pta. Describes it as pressure. [place] air travel from [place]

## 2017-02-24 NOTE — ED Notes (Signed)
No changes. EDP in to update pt/family on results and plan. VSS. Resting comfortably.

## 2017-02-24 NOTE — ED Notes (Signed)
No changes. Denies pain, nausea or sob. Mentions some light headedness. Denies questions or needs. VSS. NSR on monitor.

## 2017-02-24 NOTE — ED Notes (Signed)
Alert, NAD, calm, interactive, resps e/u, speaking in clear complete sentences, no dyspnea noted, skin W&D, VSS, here for earlier CP, [place] sx resolved, (denies: pain, sob, nausea, dizziness or visual changes). [facility]  EDP into room, prior to RN assessment, see MD notes, orders received and initiated.

## 2019-04-12 IMAGING — CT CT HEAD W/O CM
3 series · 15 of 47 positions shown, 18 images · non-contrast
Comparison: None.

CLINICAL DATA: 56-year-old male with generalized headache and
dizziness.

EXAM:
CT HEAD WITHOUT CONTRAST
TECHNIQUE: Contiguous axial images were obtained from the base of the skull
through the vertex without intravenous contrast.

[Series 2: head wo · axial · 0.46mm/px · z∈[-101,+24]mm · 9 of 31 slices shown, 12 images]
[im 3/31  brain]
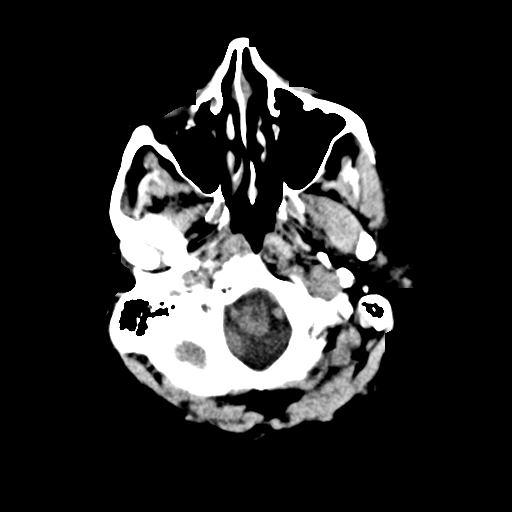
[im 3/31  bone]
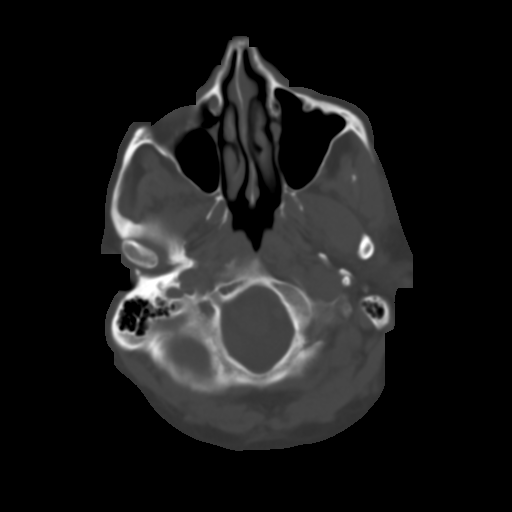
[im 6/31  brain]
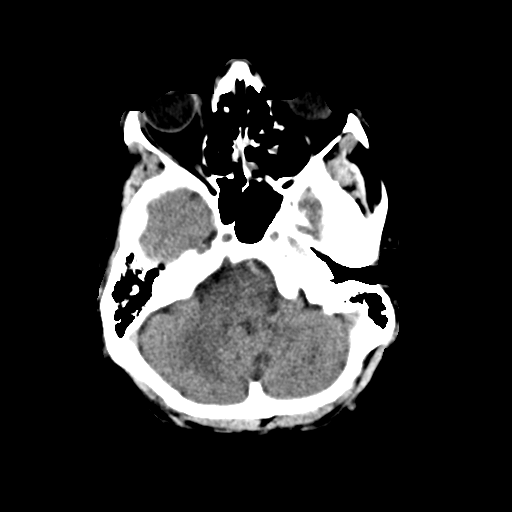
[im 9/31  brain]
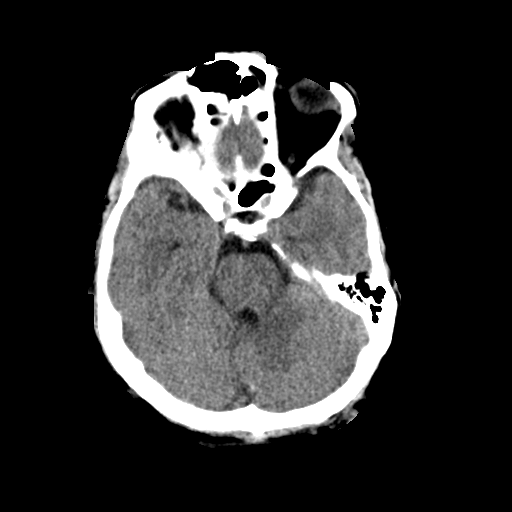
[im 12/31  brain]
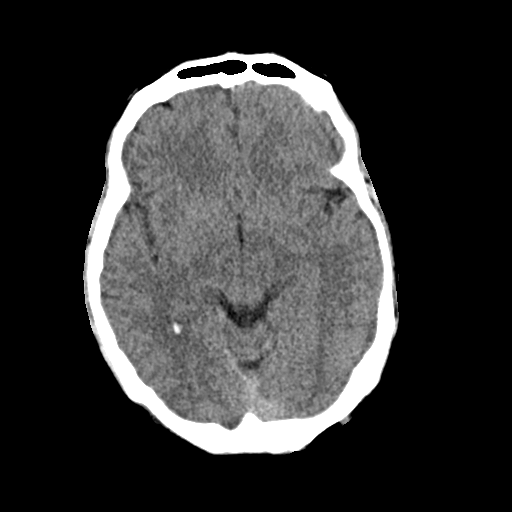
[im 16/31  brain]
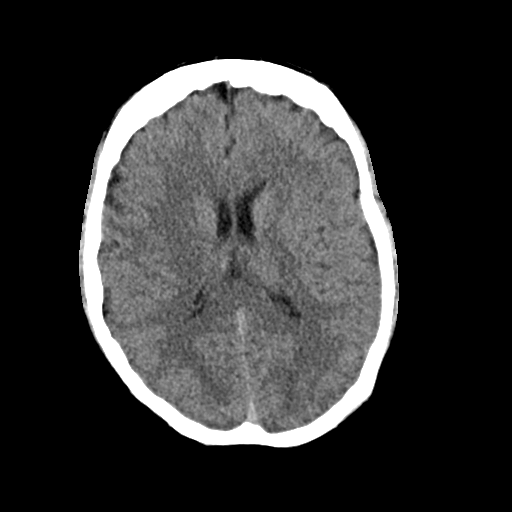
[im 16/31  bone]
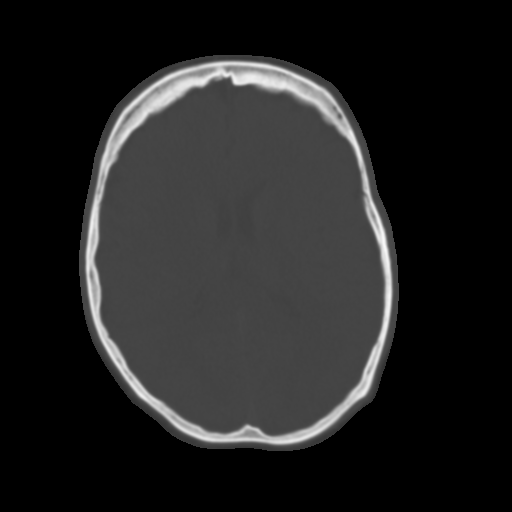
[im 19/31  brain]
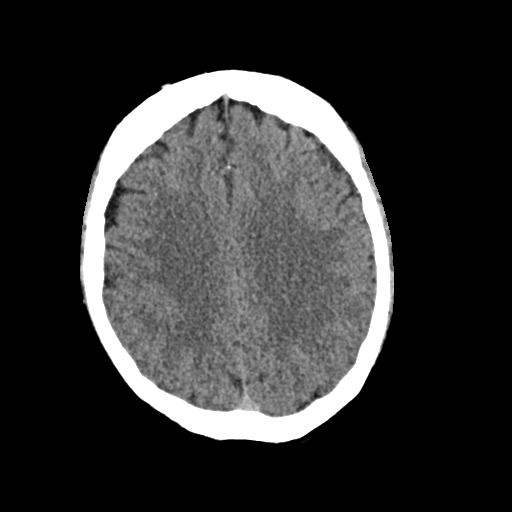
[im 22/31  brain]
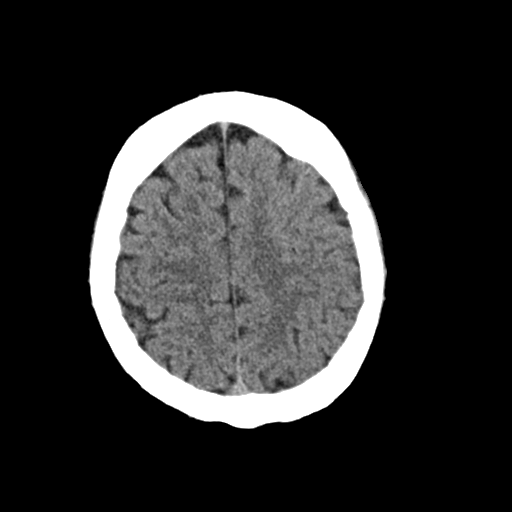
[im 25/31  brain]
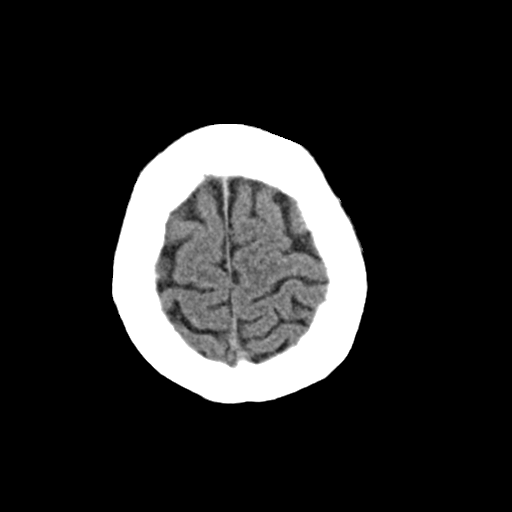
[im 28/31  brain]
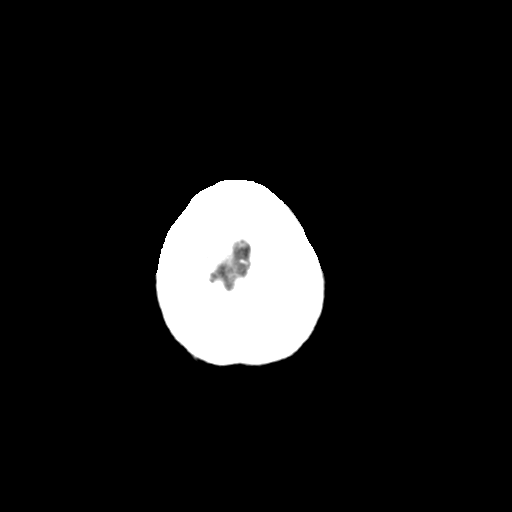
[im 28/31  bone]
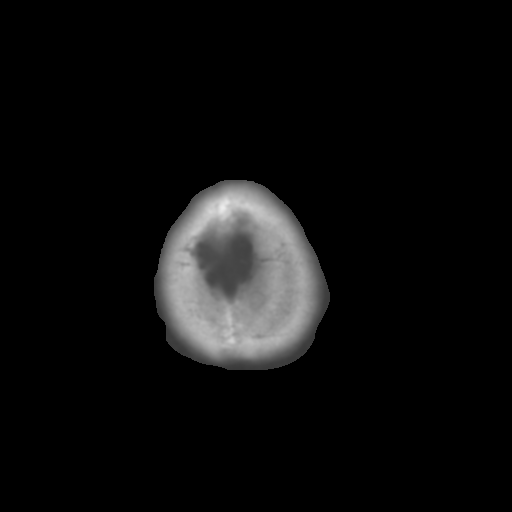

[Series 4: coronal soft · coronal · 0.30mm/px · 3 of 67 slices shown]
[im 23/67  brain]
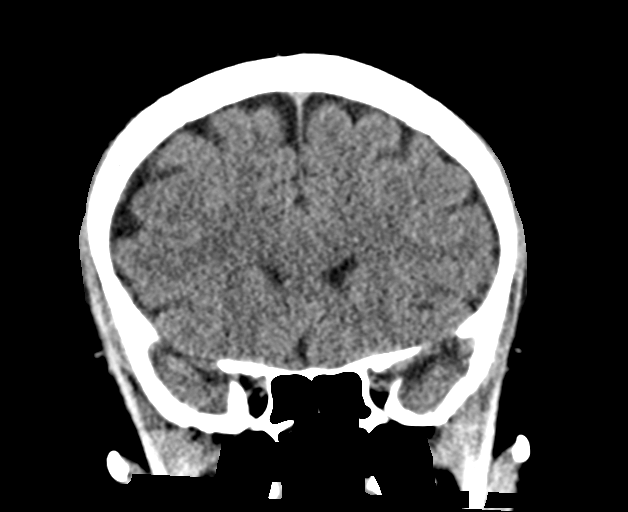
[im 30/67  brain]
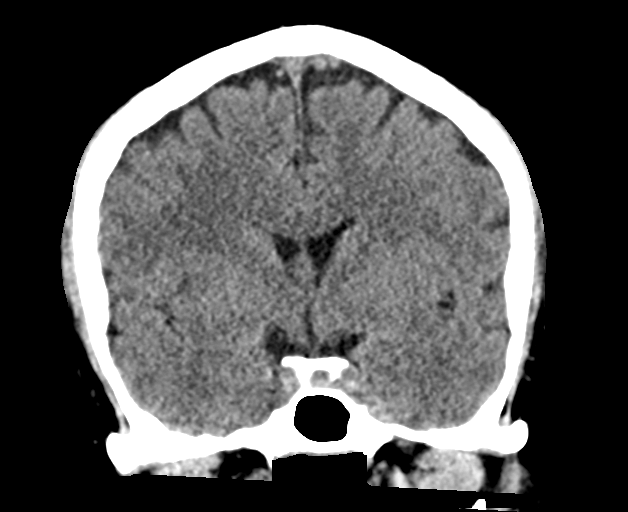
[im 37/67  brain]
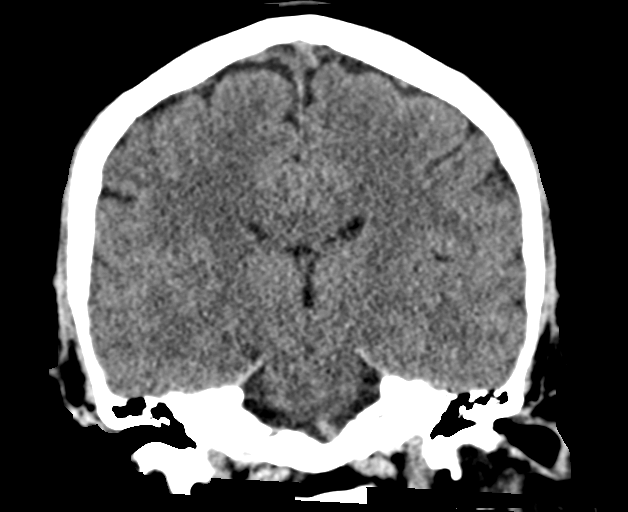

[Series 5: sag soft · sagittal · 0.29mm/px · 3 of 61 slices shown]
[im 21/61  brain]
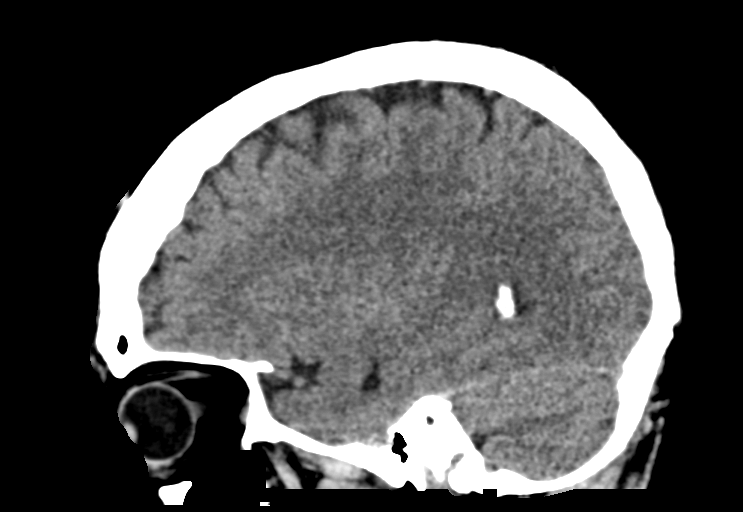
[im 31/61  brain]
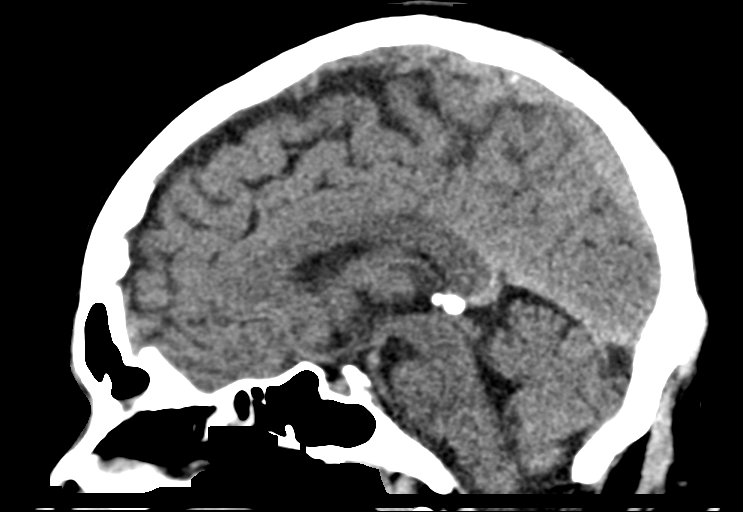
[im 41/61  brain]
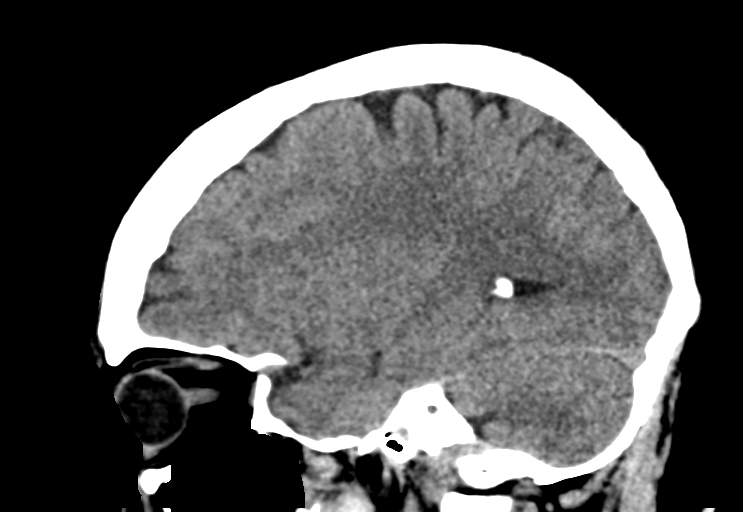

[15 of 47 positions shown; findings below may reference images not displayed]

FINDINGS: Brain: The ventricles and sulci appropriate size for patient's age.
Age the gray-white matter discrimination is preserved. There is no
acute intracranial hemorrhage. No mass effect or midline shift
noted. No extra-axial fluid collections.

Vascular: No hyperdense vessel or unexpected calcification.

Skull: Normal. Negative for fracture or focal lesion.

Sinuses/Orbits: Mild mucoperiosteal thickening of paranasal sinuses.
No air-fluid levels. Mastoid air cells are clear.

Other: None
IMPRESSION: Normal unenhanced CT of the brain.
